# Patient Record
Sex: Female | Born: 1990 | Race: White | Hispanic: Yes | Marital: Single | Smoking: Never smoker
Health system: Southern US, Community
[De-identification: ages and names within clinical notes are randomized; demographics above are authoritative.]

## PROBLEM LIST (undated history)

## (undated) DIAGNOSIS — Z789 Other specified health status: Secondary | ICD-10-CM

## (undated) HISTORY — PX: NO PAST SURGERIES: SHX2092

---

## 2013-07-20 NOTE — L&D Delivery Note (Signed)
Attestation of Attending Supervision of Advanced Practitioner (CNM/NP): Evaluation and management procedures were performed by the Advanced Practitioner under my supervision and collaboration.  I have reviewed the Advanced Practitioner's note and chart, and I agree with the management and plan.  Sabriah Hobbins 03/06/2014 12:22 PM   

## 2013-07-20 NOTE — L&D Delivery Note (Signed)
Delivery Note At 4:51 PM a viable female was delivered via Vaginal, Spontaneous Delivery (Presentation: Middle Occiput Anterior).  APGAR:8 ,9 ; weight: pending .   Placenta status: Intact, Spontaneous.  Cord:  with the following complications: nuchal cord x 1, fairly tight, unable to reduced prior to delivery- 'somersault maneuver used without difficulty.  Anesthesia: None  Episiotomy: none Lacerations: none Est. Blood Loss (mL): 200  Mom to postpartum.  Baby to Couplet care / Skin to Skin.  Shearon BaloKarim Ghanem PA-S performed delivery with CNM.  Cam HaiSHAW, Jolynda Townley CNM 03/02/2014, 5:12 PM

## 2013-09-06 LAB — OB RESULTS CONSOLE RPR: RPR: NONREACTIVE

## 2013-09-06 LAB — OB RESULTS CONSOLE RUBELLA ANTIBODY, IGM: Rubella: IMMUNE

## 2013-09-06 LAB — OB RESULTS CONSOLE HEPATITIS B SURFACE ANTIGEN: HEP B S AG: NEGATIVE

## 2013-09-06 LAB — OB RESULTS CONSOLE ANTIBODY SCREEN: Antibody Screen: NEGATIVE

## 2013-09-06 LAB — OB RESULTS CONSOLE HIV ANTIBODY (ROUTINE TESTING): HIV: NONREACTIVE

## 2013-09-06 LAB — OB RESULTS CONSOLE ABO/RH: RH TYPE: POSITIVE

## 2013-12-28 ENCOUNTER — Other Ambulatory Visit: Payer: Self-pay

## 2014-01-18 ENCOUNTER — Ambulatory Visit (HOSPITAL_COMMUNITY)
Admission: RE | Admit: 2014-01-18 | Discharge: 2014-01-18 | Disposition: A | Discharge status: Home / Self Care | Payer: Medicaid Other | Source: Ambulatory Visit | Attending: Nurse Practitioner | Admitting: Nurse Practitioner

## 2014-01-18 DIAGNOSIS — Z141 Cystic fibrosis carrier: Principal | ICD-10-CM

## 2014-01-18 DIAGNOSIS — O09899 Supervision of other high risk pregnancies, unspecified trimester: Secondary | ICD-10-CM | POA: Insufficient documentation

## 2014-01-18 NOTE — Progress Notes (Addendum)
Genetic Counseling  High-Risk Gestation Note  Appointment Date:  01/18/2014 Referred By: Carrie Click, NP Date of Birth:  [birth year] Partner:  Carrie Gay    Pregnancy History: G2P1 Estimated Date of Delivery: 03/07/14 Estimated Gestational Age: 42w1dAttending: PBenjaman Lobe MD   I met with Ms. RCentral Bridgeand her partner, Carrie Gay for genetic counseling given that routine cystic fibrosis carrier screening identified Carrie Gay Insurance Groupas a carrier for cystic fibrosis (CF).  [facility] Spanish/English interpreter, MFelicity Coyer provided interpretation for today's visit.   We reviewed the results of Carrie Gay Insurance GroupCF carrier screening.  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 CF carrier screening has not yet been performed for her partner.  [name] no additional relatives known to be CF carriers and no known relatives with cystic fibrosis.  [name] no known individuals with cystic fibrosis in his family history, and consanguinity to Ms. Pernie Benigno MAlba Destinewas denied. He reported MPolandancestry. Both family histories were reviewed and were otherwise negative for birth defects, intellectual disability, and known genetic conditions. Without further information regarding the provided family history, an accurate genetic risk cannot be calculated. Further genetic counseling is warranted if more information is obtained.  Classic features of CF include thickened secretions in the lungs, digestive and reproductive systems.  This life-limiting condition is characterized by chronic respiratory infections requiring daily chest therapies, pancreatic dysfunction disrupting the body's ability to break down food and extract nutrients as it should, which may restrict growth.  Infertility commonly occurs in males.  With therapies, such as daily respiratory therapies and medications to aid digestion, the  median lifespan for people with CF is now mid-30's. Treatment may involve lung transplantation in some cases. There can be significant variability in the severity of symptoms and expression of the disease; there is some genotype/phenotype correlation. However, severity cannot always be predicted prenatally.    We reviewed genes and spent time reviewing the autosomal recessive inheritance of cystic fibrosis (CF). We discussed that approximately 1 in 459 Individuals of Hispanic descent is a CF carrier.  We discussed that individuals who are carriers have one copy of the CFTR gene with a disease causing mutation, and their other CFTR gene copy functions correctly. Thus, carriers typically do not have associated medical symptoms. We discussed that when both parents are carriers for CF, each pregnancy has an independent chance for one of the following outcomes: a 25% chance to inherit both mutations and thus have CF; a 50% chance to inherit one gene mutation and be a carrier similar to parents; and a 25% chance to be neither a carrier nor have CF. When one parent is a CF carrier but the other is not, then each pregnancy has a 1 in 2 chance to be a CF carrier but would not be expected to be at increased risk to inherit CF.   There are thought to be thousands of mutations which can cause the CFTR gene to not function properly. Carrier screening is available to assess for the most common disease causing mutations. However, carrier screening does not identify all CF carriers. Thus, a negative CF carrier screen would reduce but not eliminate the chance to be a CF carrier and thus the chance for CF in a pregnancy.  Carrier screening for the most common mutations detects approximately 77% of carriers in the Hispanic population.  Given Carrie Gay reported family history, he would  have the general population risk to be a carrier of approximately 1 in 83, prior to carrier screening. Thus, given that Carrie Gay is a known CF carrier, the risk for CF to be in the current pregnancy, prior to carrier screening for Carrie Gay, is approximately 1 in 184 (0.5%).  We discussed that CF carrier screening for Carrie Gay would further refine the risk for CF in the current pregnancy. Carrie Gay stated that he does not currently have medical insurance. We reviewed the estimated out of pocket cost of approximately $200-250.    We reviewed that when both parents are identified to be CF carriers, prenatal diagnosis via amniocentesis (or chorionic villus sampling in the first trimester) would be available, if desired. The risks, benefits, and limitations of amniocentesis were reviewed. A fetus with cystic fibrosis typically appears normal on targeted ultrasound, although rarely echogenic bowel is visualized on ultrasound. However, the presence of echogenic bowel on targeted ultrasound is not diagnostic for CF in a pregnancy, nor does the absence of echogenic bowel on ultrasound rule out CF in the pregnancy. We discussed that postnatal testing for CF can also be performed for babies identified to be at risk to inherit CF. They understand that in [place], the newborn screening test will detect CF, but that carriers may come back as false positives.  Carrie Gay and her partner indicated that they would not be interested in prenatal diagnosis for CF via amniocentesis, even in the event that Carrie Gay was also identified to be a CF carrier. After careful consideration, Carrie Gay declined CF carrier screening today, given the associated cost and given that the couple was comfortable with postnatal assessment for cystic fibrosis for the pregnancy.   Carrie Gay denied exposure to environmental toxins or chemical agents. She denied the use of alcohol, tobacco or street drugs. She denied significant viral illnesses during the course of her pregnancy. Her medical and surgical histories were  noncontributory.   I counseled this couple regarding the above risks and available options.  The approximate face-to-face time with the genetic counselor was 45 minutes.  Chipper Oman, MS Certified Genetic Counselor 01/18/2014

## 2014-02-12 LAB — OB RESULTS CONSOLE GBS: GBS: POSITIVE

## 2014-02-12 LAB — OB RESULTS CONSOLE GC/CHLAMYDIA
CHLAMYDIA, DNA PROBE: NEGATIVE
Gonorrhea: NEGATIVE

## 2014-02-19 ENCOUNTER — Other Ambulatory Visit (HOSPITAL_COMMUNITY): Payer: Self-pay | Admitting: Nurse Practitioner

## 2014-02-19 DIAGNOSIS — Z3689 Encounter for other specified antenatal screening: Secondary | ICD-10-CM

## 2014-02-21 ENCOUNTER — Other Ambulatory Visit (HOSPITAL_COMMUNITY): Payer: Self-pay | Admitting: Nurse Practitioner

## 2014-02-21 ENCOUNTER — Ambulatory Visit (HOSPITAL_COMMUNITY)
Admission: RE | Admit: 2014-02-21 | Discharge: 2014-02-21 | Disposition: A | Discharge status: Home / Self Care | Payer: Medicaid Other | Source: Ambulatory Visit | Attending: Nurse Practitioner | Admitting: Nurse Practitioner

## 2014-02-21 DIAGNOSIS — Z1389 Encounter for screening for other disorder: Secondary | ICD-10-CM | POA: Insufficient documentation

## 2014-02-21 DIAGNOSIS — Z363 Encounter for antenatal screening for malformations: Secondary | ICD-10-CM | POA: Insufficient documentation

## 2014-02-21 DIAGNOSIS — Z3689 Encounter for other specified antenatal screening: Secondary | ICD-10-CM

## 2014-02-21 DIAGNOSIS — O358XX0 Maternal care for other (suspected) fetal abnormality and damage, not applicable or unspecified: Secondary | ICD-10-CM | POA: Insufficient documentation

## 2014-03-02 ENCOUNTER — Inpatient Hospital Stay (HOSPITAL_COMMUNITY)
Admission: AD | Admit: 2014-03-02 | Discharge: 2014-03-04 | DRG: 775 | Disposition: A | Discharge status: Home / Self Care | Payer: Medicaid Other | Source: Ambulatory Visit | Attending: Obstetrics and Gynecology | Admitting: Obstetrics and Gynecology

## 2014-03-02 ENCOUNTER — Encounter (HOSPITAL_COMMUNITY): Payer: Self-pay | Admitting: *Deleted

## 2014-03-02 DIAGNOSIS — O99892 Other specified diseases and conditions complicating childbirth: Secondary | ICD-10-CM | POA: Diagnosis present

## 2014-03-02 DIAGNOSIS — Z22330 Carrier of Group B streptococcus: Secondary | ICD-10-CM

## 2014-03-02 DIAGNOSIS — O9989 Other specified diseases and conditions complicating pregnancy, childbirth and the puerperium: Secondary | ICD-10-CM

## 2014-03-02 DIAGNOSIS — IMO0001 Reserved for inherently not codable concepts without codable children: Secondary | ICD-10-CM

## 2014-03-02 DIAGNOSIS — O479 False labor, unspecified: Secondary | ICD-10-CM | POA: Diagnosis present

## 2014-03-02 HISTORY — DX: Other specified health status: Z78.9

## 2014-03-02 LAB — ABO/RH: ABO/RH(D): O POS

## 2014-03-02 LAB — CBC
HCT: 39.9 % (ref 36.0–46.0)
Hemoglobin: 13.7 g/dL (ref 12.0–15.0)
MCH: 33.3 pg (ref 26.0–34.0)
MCHC: 34.3 g/dL (ref 30.0–36.0)
MCV: 96.8 fL (ref 78.0–100.0)
Platelets: 198 10*3/uL (ref 150–400)
RBC: 4.12 MIL/uL (ref 3.87–5.11)
RDW: 13.3 % (ref 11.5–15.5)
WBC: 8.2 10*3/uL (ref 4.0–10.5)

## 2014-03-02 LAB — TYPE AND SCREEN
ABO/RH(D): O POS
ANTIBODY SCREEN: NEGATIVE

## 2014-03-02 MED ORDER — ONDANSETRON HCL 4 MG/2ML IJ SOLN: 4.0000 mg | Freq: Four times a day (QID) | INTRAMUSCULAR | Status: DC | PRN

## 2014-03-02 MED ORDER — PRENATAL MULTIVITAMIN CH: 1.0000 | ORAL_TABLET | Freq: Every day | ORAL | Status: DC

## 2014-03-02 MED ORDER — ONDANSETRON HCL 4 MG PO TABS: 4.0000 mg | ORAL_TABLET | ORAL | Status: DC | PRN

## 2014-03-02 MED ORDER — OXYTOCIN 40 UNITS IN LACTATED RINGERS INFUSION - SIMPLE MED: 62.5000 mL/h | INTRAVENOUS | Status: DC

## 2014-03-02 MED ORDER — LACTATED RINGERS IV SOLN: 500.0000 mL | INTRAVENOUS | Status: DC | PRN

## 2014-03-02 MED ORDER — PRENATAL MULTIVITAMIN CH
1.0000 | ORAL_TABLET | Freq: Every day | ORAL | Status: DC
  Administered 2014-03-03 – 2014-03-04 (×2): 1 via ORAL
  Filled 2014-03-02 (×2): qty 1

## 2014-03-02 MED ORDER — LACTATED RINGERS IV SOLN
INTRAVENOUS | Status: DC
  Administered 2014-03-02: 16:00:00 via INTRAVENOUS

## 2014-03-02 MED ORDER — LIDOCAINE HCL (PF) 1 % IJ SOLN
30.0000 mL | INTRAMUSCULAR | Status: DC | PRN
Start: 2014-03-02 — End: 2014-03-02
  Filled 2014-03-02: qty 30

## 2014-03-02 MED ORDER — DIBUCAINE 1 % RE OINT: 1.0000 "application " | TOPICAL_OINTMENT | RECTAL | Status: DC | PRN

## 2014-03-02 MED ORDER — LACTATED RINGERS IV SOLN: INTRAVENOUS | Status: DC

## 2014-03-02 MED ORDER — CITRIC ACID-SODIUM CITRATE 334-500 MG/5ML PO SOLN: 30.0000 mL | ORAL | Status: DC | PRN

## 2014-03-02 MED ORDER — IBUPROFEN 600 MG PO TABS: 600.0000 mg | ORAL_TABLET | Freq: Four times a day (QID) | ORAL | Status: DC | PRN

## 2014-03-02 MED ORDER — TETANUS-DIPHTH-ACELL PERTUSSIS 5-2.5-18.5 LF-MCG/0.5 IM SUSP: 0.5000 mL | Freq: Once | INTRAMUSCULAR | Status: DC

## 2014-03-02 MED ORDER — OXYTOCIN BOLUS FROM INFUSION: 500.0000 mL | INTRAVENOUS | Status: DC

## 2014-03-02 MED ORDER — WITCH HAZEL-GLYCERIN EX PADS: 1.0000 "application " | MEDICATED_PAD | CUTANEOUS | Status: DC | PRN

## 2014-03-02 MED ORDER — AMPICILLIN SODIUM 2 G IJ SOLR
2.0000 g | Freq: Once | INTRAMUSCULAR | Status: AC
  Administered 2014-03-02: 2 g via INTRAVENOUS
  Filled 2014-03-02: qty 2000

## 2014-03-02 MED ORDER — OXYCODONE-ACETAMINOPHEN 5-325 MG PO TABS
1.0000 | ORAL_TABLET | ORAL | Status: DC | PRN
  Administered 2014-03-03 – 2014-03-04 (×4): 1 via ORAL
  Filled 2014-03-02 (×4): qty 1

## 2014-03-02 MED ORDER — IBUPROFEN 600 MG PO TABS
600.0000 mg | ORAL_TABLET | Freq: Four times a day (QID) | ORAL | Status: DC | PRN
Start: 2014-03-02 — End: 2014-03-02
  Administered 2014-03-02: 600 mg via ORAL
  Filled 2014-03-02: qty 1

## 2014-03-02 MED ORDER — BENZOCAINE-MENTHOL 20-0.5 % EX AERO
1.0000 "application " | INHALATION_SPRAY | CUTANEOUS | Status: DC | PRN
  Filled 2014-03-02: qty 56

## 2014-03-02 MED ORDER — OXYCODONE-ACETAMINOPHEN 5-325 MG PO TABS: 1.0000 | ORAL_TABLET | ORAL | Status: DC | PRN

## 2014-03-02 MED ORDER — ACETAMINOPHEN 325 MG PO TABS: 650.0000 mg | ORAL_TABLET | ORAL | Status: DC | PRN

## 2014-03-02 MED ORDER — ONDANSETRON HCL 4 MG/2ML IJ SOLN: 4.0000 mg | INTRAMUSCULAR | Status: DC | PRN

## 2014-03-02 MED ORDER — LIDOCAINE HCL (PF) 1 % IJ SOLN: 30.0000 mL | INTRAMUSCULAR | Status: DC | PRN

## 2014-03-02 MED ORDER — IBUPROFEN 600 MG PO TABS
600.0000 mg | ORAL_TABLET | Freq: Four times a day (QID) | ORAL | Status: DC
  Administered 2014-03-02 – 2014-03-04 (×8): 600 mg via ORAL
  Filled 2014-03-02 (×8): qty 1

## 2014-03-02 MED ORDER — OXYTOCIN 40 UNITS IN LACTATED RINGERS INFUSION - SIMPLE MED
62.5000 mL/h | INTRAVENOUS | Status: DC
  Administered 2014-03-02: 62.5 mL/h via INTRAVENOUS
  Filled 2014-03-02: qty 1000

## 2014-03-02 MED ORDER — FLEET ENEMA 7-19 GM/118ML RE ENEM
1.0000 | ENEMA | RECTAL | Status: DC | PRN
Start: 2014-03-02 — End: 2014-03-02

## 2014-03-02 MED ORDER — OXYCODONE-ACETAMINOPHEN 5-325 MG PO TABS
1.0000 | ORAL_TABLET | ORAL | Status: DC | PRN
  Administered 2014-03-02: 1 via ORAL
  Filled 2014-03-02: qty 1

## 2014-03-02 MED ORDER — ZOLPIDEM TARTRATE 5 MG PO TABS: 5.0000 mg | ORAL_TABLET | Freq: Every evening | ORAL | Status: DC | PRN

## 2014-03-02 MED ORDER — SIMETHICONE 80 MG PO CHEW: 80.0000 mg | CHEWABLE_TABLET | ORAL | Status: DC | PRN

## 2014-03-02 MED ORDER — ACETAMINOPHEN 325 MG PO TABS
650.0000 mg | ORAL_TABLET | ORAL | Status: DC | PRN
Start: 2014-03-02 — End: 2014-03-02

## 2014-03-02 MED ORDER — LANOLIN HYDROUS EX OINT: TOPICAL_OINTMENT | CUTANEOUS | Status: DC | PRN

## 2014-03-02 MED ORDER — SODIUM CHLORIDE 0.9 % IV SOLN: 2.0000 g | Freq: Once | INTRAVENOUS | Status: DC

## 2014-03-02 MED ORDER — DIPHENHYDRAMINE HCL 25 MG PO CAPS: 25.0000 mg | ORAL_CAPSULE | Freq: Four times a day (QID) | ORAL | Status: DC | PRN

## 2014-03-02 MED ORDER — SENNOSIDES-DOCUSATE SODIUM 8.6-50 MG PO TABS
2.0000 | ORAL_TABLET | ORAL | Status: DC
  Administered 2014-03-02 – 2014-03-03 (×2): 2 via ORAL
  Filled 2014-03-02 (×2): qty 2

## 2014-03-02 NOTE — MAU Note (Signed)
PT  SAYS SHE GETS PNC  WITH HD - INTERPRETER- MADAY,     LAST SEEN ON Monday- VE - 1 CM.   DENIES HSV AND MRSA.   GBS- POSITIVE.

## 2014-03-02 NOTE — H&P (Signed)
Carrie Gay is a 23 y.o. female G2P1001 @ 38.5wks presenting to the Labor and Delivery suite for active labor. Denies bldg, N/V/D. Maternal Medical History:  Reason for admission: Nausea.    OB History   Grav Para Term Preterm Abortions TAB SAB Ect Mult Living   2 1 1       1      Past Medical History  Diagnosis Date  . Medical history non-contributory    Past Surgical History  Procedure Laterality Date  . No past surgeries     Family History: family history is not on file. Social History:  reports that she has never smoked. She does not have any smokeless tobacco history on file. She reports that she does not drink alcohol or use illicit drugs.   Prenatal Transfer Tool  Maternal Diabetes: No Genetic Screening: Abnormal:  Results: Other: Quad Maternal Ultrasounds/Referrals: Normal Fetal Ultrasounds or other Referrals:  None Maternal Substance Abuse:  No Significant Maternal Medications:  None Significant Maternal Lab Results:  Lab values include: Group B Strep positive Other Comments:  Cystic Fibrosis Carrier  Review of Systems  Constitutional: Positive for diaphoresis. Negative for fever and chills.  Eyes: Negative for blurred vision and double vision.  Gastrointestinal: Negative for nausea and vomiting.  Neurological: Negative for dizziness, tingling and headaches.    Dilation: 8.5 Effacement (%): 90 Exam by:: DCALLAWAY, RN Blood pressure 120/76, pulse 62, temperature 97.3 F (36.3 C), temperature source Oral, resp. rate 20, height 4\' 10"  (1.473 m), weight 56.7 kg (125 lb). Exam Physical Exam  Constitutional: She appears well-developed and well-nourished. She appears distressed.  HENT:  Head: Normocephalic and atraumatic.   EFM 130s, +accels, no decels Toco: ctx q 2 mins  Prenatal labs: ABO, Rh: O/Positive/-- (02/18 0000) Antibody: Negative (02/18 0000) Rubella: Immune (02/18 0000) RPR: Nonreactive (02/18 0000)  HBsAg: Negative (02/18 0000)  HIV:  Non-reactive (02/18 0000)  GBS: Positive (07/27 0000)   Assessment/Plan: Advanced Active Labor  GBS pos  [facility] Anticipate SVD Expectant management Attempt to give Amp for GBS prophylaxis  Shearon BaloGhanem, Karim M 03/02/2014, 4:22 PM  I have seen and examined this patient and I agree with the above. Cam HaiSHAW, Karlynn Furrow CNM 10:11 PM 03/09/2014

## 2014-03-03 LAB — RPR

## 2014-03-03 NOTE — Progress Notes (Signed)
Post Partum Day 1 Subjective: Eating, drinking, voiding, ambulating well.  +flatus.  Lochia and pain wnl.  Denies dizziness, lightheadedness, or sob. No complaints.  Rapid birth, pt reports birthing w/in 1hr of coming to hospital, GBS pos, so inadequate tx  Objective: Blood pressure 119/61, pulse 55, temperature 98.1 F (36.7 C), temperature source Oral, resp. rate 18, height 4\' 10"  (1.473 m), weight 56.7 kg (125 lb), SpO2 99.00%, unknown if currently breastfeeding.  Physical Exam:  General: alert, cooperative and no distress Lochia: appropriate Uterine Fundus: firm Incision: n/a DVT Evaluation: No evidence of DVT seen on physical exam. Negative Homan's sign. No cords or calf tenderness. No significant calf/ankle edema.   Recent Labs  03/02/14 1615  HGB 13.7  HCT 39.9    Assessment/Plan: Plan for discharge tomorrow and Breastfeeding Plans depo for contraception   LOS: 1 day   Marge DuncansBooker, [place] Miyoshi Randall 03/03/2014, 8:54 AM

## 2014-03-03 NOTE — Lactation Note (Signed)
This note was copied from the chart of Carrie Gay. Lactation Consultation Note  Interpreter Jessica Present.  P2.  Ex BF one year. Mother denies problems or soreness.   Mother states she has been taught hand expression and has seen colostrum. Reviewed basics, Baby & Me booklet. Mom encouraged to feed baby 8-12 times/24 hours and with feeding cues.  Mom made aware of O/P services, breastfeeding support groups, community resources, and our phone # for post-discharge questions.  Encouraged mother to have RN view her next feeding to access latch.     Patient Name: Carrie Awilda BillRubi Benigno Gay ZOXWR'UToday's Date: 03/03/2014 Reason for consult: Initial assessment   Maternal Data Has patient been taught Hand Expression?: Yes Does the patient have breastfeeding experience prior to this delivery?: Yes  Feeding Feeding Type: Breast Fed Length of feed: 10 min  LATCH Score/Interventions                      Lactation Tools Discussed/Used     Consult Status Consult Status: Follow-up Date: 03/04/14 Follow-up type: In-patient    Dahlia ByesBerkelhammer, [facility] 03/03/2014, 12:15 PM

## 2014-03-04 MED ORDER — IBUPROFEN 600 MG PO TABS: 600.0000 mg | ORAL_TABLET | Freq: Four times a day (QID) | ORAL | Status: AC

## 2014-03-04 NOTE — Discharge Summary (Signed)
Obstetric Discharge Summary Reason for Admission: onset of labor Prenatal Procedures: ultrasound Intrapartum Procedures: spontaneous vaginal delivery Postpartum Procedures: none Complications-Operative and Postpartum: none Hemoglobin  Date Value Ref Range Status  03/02/2014 13.7  12.0 - 15.0 g/dL Final     HCT  Date Value Ref Range Status  03/02/2014 39.9  36.0 - 46.0 % Final    Physical Exam:  General: alert, cooperative, appears stated age and no distress Lochia: appropriate Uterine Fundus: firm Incision: n/a DVT Evaluation: No evidence of DVT seen on physical exam. Negative Homan's sign. No significant calf/ankle edema.  Discharge Diagnoses: Term Pregnancy-delivered  Discharge Information: Date: 03/04/2014 Activity: pelvic rest Diet: routine Medications: PNV and Ibuprofen Condition: stable and improved Instructions: refer to practice specific booklet Discharge to: home Follow-up Information   [facility] Schedule an appointment as soon as possible for a visit in 6 weeks. (postpartum follow up and Depo)    Specialty:  Home Health Services   Contact information:   [facility] Coord[facility] [address] [place] [address] [phone]       Newborn Data: Live born female  Birth Weight: 6 lb 4.5 oz (2850 g) APGAR: 8, 9  Home with mother.  Carrie Gay, Carrie Gay 03/04/2014, 12:12 PM

## 2014-03-04 NOTE — Discharge Summary (Signed)
Obstetric Discharge Summary Reason for Admission: onset of labor Prenatal Procedures: NST Intrapartum Procedures: spontaneous vaginal delivery Postpartum Procedures: none Complications-Operative and Postpartum: none Hemoglobin  Date Value Ref Range Status  03/02/2014 13.7  12.0 - 15.0 g/dL Final     HCT  Date Value Ref Range Status  03/02/2014 39.9  36.0 - 46.0 % Final   Hospital Course:  Patient was seen in MAU and found to be in active labor. She was admitted to L&D where she subsequently underwent NSVD without incident. She had an uncomplicated postpartum course. She is now ambulating, tolerating po, +BM, pain well controlled, and minimal vaginal bleeding. She desires depo for birth control and she is breast / bottle feeding.   Delivery Note  At 4:51 PM a viable female was delivered via Vaginal, Spontaneous Delivery (Presentation: Middle Occiput Anterior). APGAR:8 ,9 ; weight: pending .  Placenta status: Intact, Spontaneous. Cord: with the following complications: nuchal cord x 1, fairly tight, unable to reduced prior to delivery- 'somersault maneuver used without difficulty.  Anesthesia: None  Episiotomy: none  Lacerations: none  Est. Blood Loss (mL): 200  Mom to postpartum. Baby to Couplet care / Skin to Skin.  Shearon BaloKarim Ghanem PA-S performed delivery with CNM.  Cam HaiSHAW, KIMBERLY CNM  03/02/2014, 5:12 PM    Physical Exam:  General: alert, cooperative and no distress Lochia: appropriate Uterine Fundus: firm Incision: N/A DVT Evaluation: No evidence of DVT seen on physical exam. No cords or calf tenderness.  Discharge Diagnoses: Term Pregnancy-delivered  Discharge Information: Date: 03/04/2014 Activity: unrestricted and pelvic rest Diet: routine Medications: PNV and Ibuprofen Condition: stable Instructions: refer to practice specific booklet Discharge to: home Follow-up Information   [facility] Schedule an appointment as soon  as possible for a visit in 6 weeks. (postpartum follow up and Depo)    Specialty:  Home Health Services   Contact information:   [facility] Coordination for [facility] [address] [place] [address] [phone]       Newborn Data: Live born female  Birth Weight: 6 lb 4.5 oz (2850 g) APGAR: 8, 9  Home with mother.  Sherral Dirocco, Hillery HunterCaleb G 03/04/2014, 7:33 AM

## 2014-03-04 NOTE — Lactation Note (Signed)
This note was copied from the chart of Carrie Lennar Corporationubi Benigno Gay. Lactation Consultation Note     Follow up consult with this mom and term baby, weighing under 6 pounds today, at 5-14, and 6% weight loss. Mom reports breast feeding going well, and the baby feeding more than every 3 hours.  I advised that if he does not wake to feed at least every 3, to try and feed at least this often, due to his small size. I observed mom latching baby. She used cradle hold, and he latched easily, but his bottom lip was pulled in. I showed mom how to latch in cross cradle, and then switch to cradle, to obtain a deeper latch. i also showed her to keep the baby across her chest, and skin to skin. She has been breast feeding with the baby wrapped in  blanket.  Mom may have to go back to work, and was planning on breast feeding only at night. i explained that she may lose her milk supply this way. 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 i advised that when looking for a job, that she consider whether or not she would have time to pump. I told her by law she is allowed to pump at work.  Mom very receptive to my teaching. I spoke to mom through a Spanish interpreter for the consult.  Patient Name: Carrie Gay HYQMV'HToday's Date: 03/04/2014 Reason for consult: Follow-up assessment;Infant < 6lbs   Maternal Data    Feeding    LATCH Score/Interventions Latch: Grasps breast easily, tongue down, lips flanged, rhythmical sucking.  Audible Swallowing: A few with stimulation  Type of Nipple: Everted at rest and after stimulation  Comfort (Breast/Nipple): Soft / non-tender     Hold (Positioning): Assistance needed to correctly position infant at breast and maintain latch. Intervention(s): Breastfeeding basics reviewed;Support Pillows;Position options;Skin to skin  LATCH Score: 8  Lactation Tools Discussed/Used     Consult Status Consult Status: Complete Follow-up type: Call as  needed    Alfred LevinsLee, Catie Chiao Anne 03/04/2014, 1:46 PM

## 2014-03-04 NOTE — Discharge Instructions (Signed)
Antes de que el bebé llegue a casa °(Before Baby Comes Home) °Estas son algunas cosas que usted debe tener antes que su bebé llegue a casa. Pregunte nuevamente si no comprende. Pregunte cuándo debe ver al médico nuevamente. °· Asiento infantil para el auto (exigido por ley). °· Camas separadas. °¨ Si no tiene una cama para el bebé o una cuna, puede hacerla con un cajón de una cómoda, una caja, cartón, cesta para la ropa, etc. °¨ No deje que el bebé duerma en una cama con usted u otra persona. °Insumos para la alimentación infantil: °· 6 a 8 botellas (8 onzas). °· 6 a 8 tetinas. °· Jarra medidora. °· Cuchara medidora. °· Cepillo para limpiar botellas. °· Esterilizador (o use cualquier olla o sartén grande con tapa). °· Leche maternizada que contiene hierro. °· Elementos para hervir y enfriar el agua. °Insumos de lactancia materna. °· Sacaleche. °· Crema para los pezones. °Ropa: °· 24 a 36 pañales y cubrepañales de plástico y una caja de pañales desechables. Usted puede necesitar tanto como 10 a 12 pañales por día. °· 3 camisas (otras prendas dependerá de la época del año y el clima). °· 3 mantillas. °· 3 pijamas o camisón de bebé . °· 3 baberos. °Equipamiento de baño: °· Jabón suave. °· Vaselina. No use aceite o talco para el bebé . °· Toalla de tela suave y paño para lavarlo. °· Pompones de algodón. °· Fuente de baño especial para el bebé. Sólo bañe al bebé con una esponja hasta que el cordón umbilical y la circuncisión hayan curado. °Otros suministros: °· Un termómetro y una pera de goma que serán entregados para que lleve a su casa cuando el bebé salga del hospital. Pregunte al médico cómo debe tomar la temperatura del bebé. °· 1 ó 2 chupetes. °Prepárese para una emergencia:  °· Sepa cómo llegar al hospital y sepa en qué lugar admiten al bebé. °· Reúna todos los números telefónicos de los proveedores cerca del teléfono de la casa y en su celular, si tiene uno. °Prepare a su familia:  °· Hable con sus hijos acerca  del nuevo bebé y pregunte qué piensan al respecto. °· Decida cómo desea manejar las visitas y a los otros miembros de la familia. °· Acepte ofertas de ayuda con el bebé. Necesitará tiempo para adaptarse. °Sepa cuándo debe llamar al médico:  °SOLICITE AYUDA DE INMEDIATO SI:  °· Presenta una temperatura mayor a 100.4° F (38° C). °· Abultamiento de las fontanelas en la cabeza. °· Síntomas de deshidratación como llanto sin lágrimas o ausencia de pañales mojados luego de 6 horas. °· Respiración rápida. °· Disminuye el estado de alerta. °Document Released: 10/02/2008 Document Revised: 09/28/2011 °ExitCare® Patient Information ©2015 [employer]. This information is not intended to replace advice given to you by your health care provider. Make sure you discuss any questions you have with your health care provider.Parto vaginal, Cuidados posteriores  °(Vaginal Delivery, Care After) °Siga estas instrucciones durante las próximas semanas. Estas indicaciones para el alta le proporcionan información general acerca de cómo deberá cuidarse después del parto. El médico también podrá darle instrucciones específicas. El tratamiento ha sido planificado según las prácticas médicas actuales, pero en algunos casos pueden ocurrir problemas. Comuníquese con el médico si tiene algún problema o tiene preguntas al volver a su casa.  °INSTRUCCIONES PARA EL CUIDADO EN EL HOGAR  °· Tome sólo medicamentos de venta libre o recetados, según las indicaciones del médico o del farmacéutico. °· No beba alcohol, especialmente si está   amamantando o toma analgésicos. °· No mastique tabaco ni fume. °· No consuma drogas. °· Continúe con un adecuado cuidado perineal. El buen cuidado perineal incluye: °¨ Higienizarse de adelante hacia atrás. °¨ Mantener la zona perineal limpia. °· No use tampones ni duchas vaginales hasta que su médico la autorice. °· Dúchese, lávese el cabello y tome baños de inmersión según las indicaciones de su médico. °· Utilice un  sostén que le ajuste bien y que brinde buen soporte a sus mamas. °· Consuma alimentos saludables. °· Beba suficiente líquido para mantener la orina clara o de color amarillo pálido. °· Consuma alimentos ricos en fibra como cereales y panes integrales, arroz, frijoles y frutas y verduras frescas todos los días. Estos alimentos pueden ayudarla a prevenir o aliviar el estreñimiento. °· Siga las recomendaciones de su médico relacionadas con la reanudación de actividades como subir escaleras, conducir automóviles, levantar objetos, hacer ejercicios o viajar. °· Hable con su médico acerca de reanudar la actividad sexual. Volver a la actividad sexual depende del riesgo de infección, la velocidad de la curación y la comodidad y su deseo de reanudarla. °· Trate de que alguien la ayude con las actividades del hogar y con el recién nacido al menos durante un par de días después de salir del hospital. °· Descanse todo lo que pueda. Trate de descansar o tomar una siesta mientras el bebé está durmiendo. °· Aumente sus actividades gradualmente. °· Cumpla con todas las visitas de control programadas para después del parto. Es muy importante asistir a todas las citas programadas de seguimiento. En estas citas, su médico va a controlarla para asegurarse de que esté sanando física y emocionalmente. °SOLICITE ATENCIÓN MÉDICA SI:  °· Elimina coágulos grandes por la vagina. Guarde algunos coágulos para mostrarle al médico. °· Tiene una secreción con feo olor que proviene de la vagina. °· Tiene dificultad para orinar. °· Orina con frecuencia. °· Siente dolor al orinar. °· Nota un cambio en sus movimientos intestinales. °· Aumenta el enrojecimiento, el dolor o la hinchazón en la zona de la incisión vaginal (episiotomía) o el desgarro vaginal. °· Tiene pus que drena por la episiotomía o el desgarro vaginal. °· La episiotomía o el desgarro vaginal se abren. °· Sus mamas le duelen, están duras o enrojecidas. °· Sufre un dolor intenso de  cabeza. °· Tiene visión borrosa o ve manchas. °· Se siente triste o deprimida. °· Tiene pensamientos acerca de lastimarse o dañar al recién nacido. °· Tiene preguntas acerca de su cuidado personal, el cuidado del recién nacido o acerca de los medicamentos. °· Se siente mareada o sufre un desmayo. °· Tiene una erupción. °· Tiene náuseas o vómitos. °· Usted amamantó al bebé y no ha tenido su período menstrual dentro de las 12 semanas después de dejar de amamantar. °· No amamanta al bebé y no tuvo su período menstrual en las últimas 12° semanas después del parto. °· Tiene fiebre. °SOLICITE ATENCIÓN MÉDICA DE INMEDIATO SI:  °· Siente dolor persistente. °· Siente dolor en el pecho. °· Le falta el aire. °· Se desmaya. °· Siente dolor en la pierna. °· Siente dolor en el estómago. °· El sangrado vaginal satura dos o más apósitos en 1 hora. °ASEGÚRESE DE QUE:  °· Comprende estas instrucciones. °· Controlará su enfermedad. °· Recibirá ayuda de inmediato si no mejora o si empeora. °Document Released: 07/06/2005 Document Revised: 03/08/2013 °ExitCare® Patient Information ©2015 [employer]. This information is not intended to replace advice given to you by your health care provider. Make   sure you discuss any questions you have with your health care provider. ° °

## 2014-04-01 NOTE — Discharge Summary (Signed)
i agree with above assessment 

## 2014-05-22 ENCOUNTER — Encounter (HOSPITAL_COMMUNITY): Payer: Self-pay | Admitting: *Deleted

## 2015-06-26 IMAGING — US US OB DETAIL+14 WK
1 series · 12 of 28 positions shown · non-contrast
Comparison: none

[Series 1: us ob comp +14 wk · 12 of 84 slices shown]
[im 4/84]
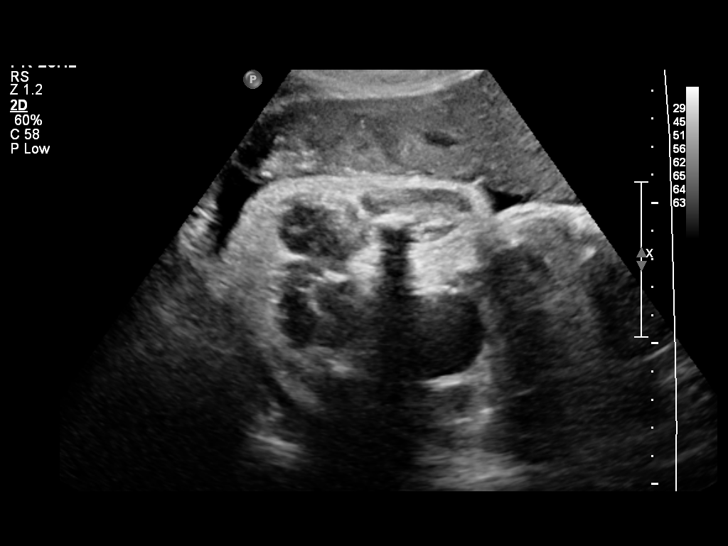
[im 10/84]
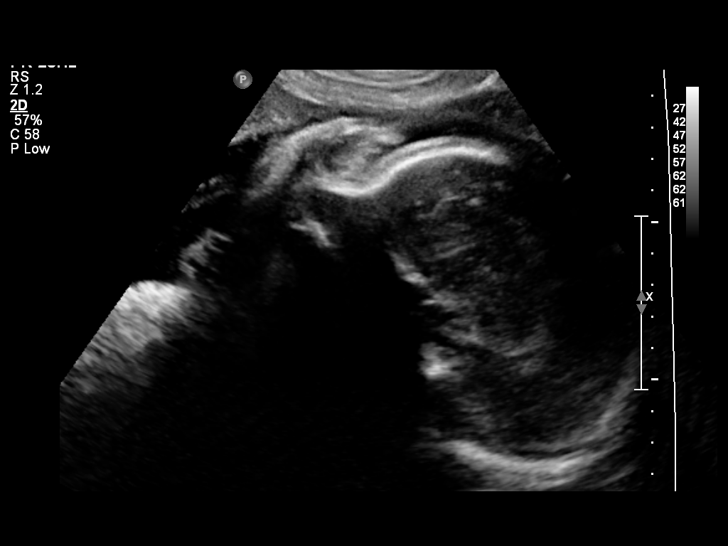
[im 16/84]
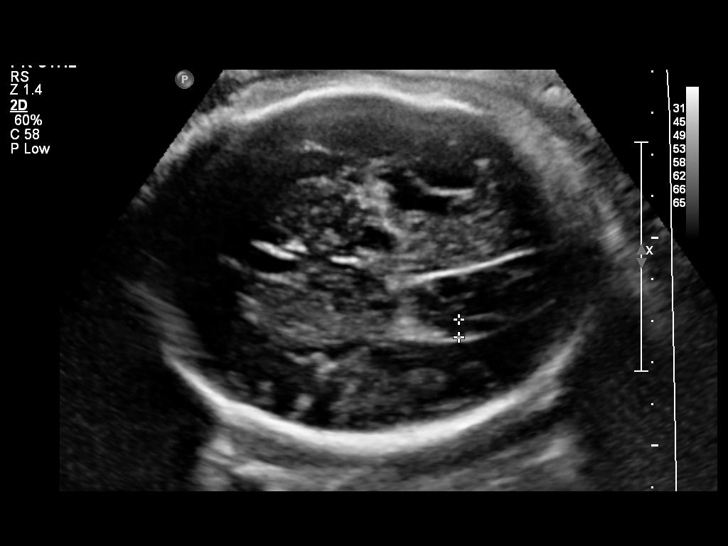
[im 25/84]
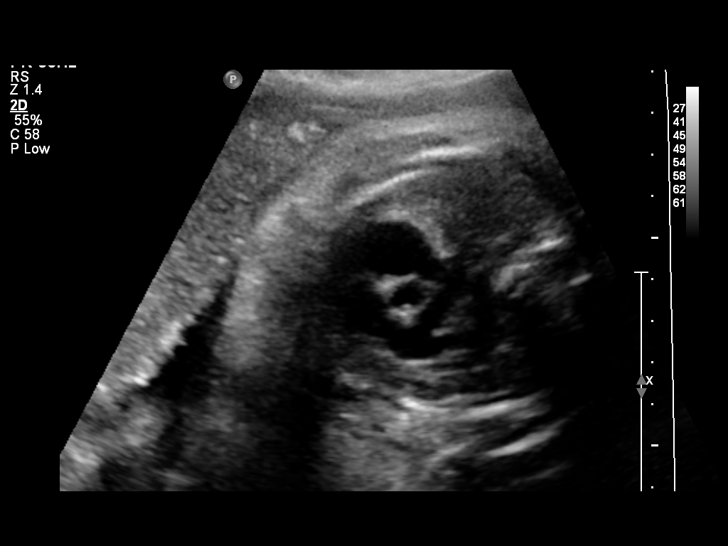
[im 31/84]
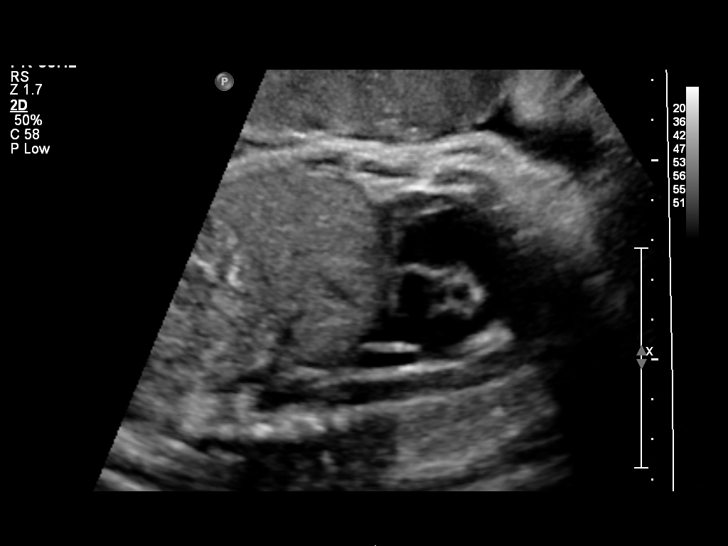
[im 37/84]
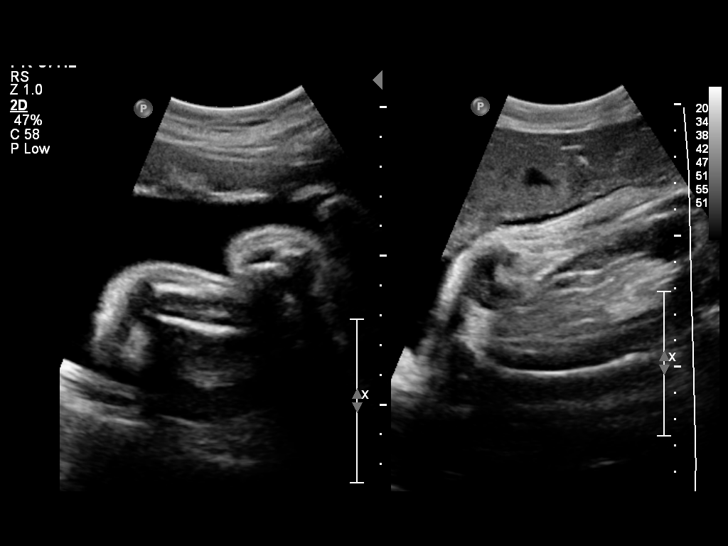
[im 47/84]
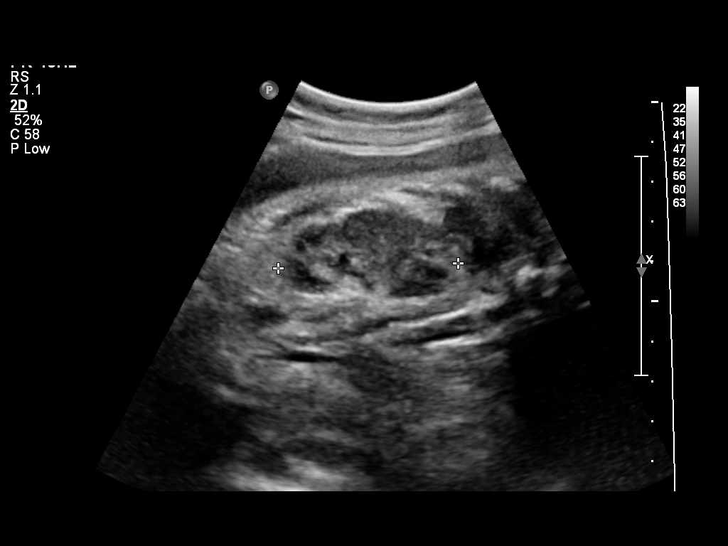
[im 53/84]
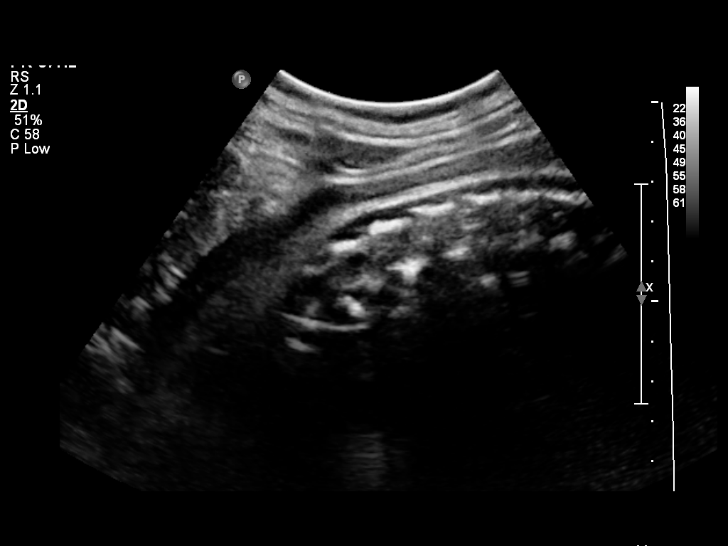
[im 59/84]
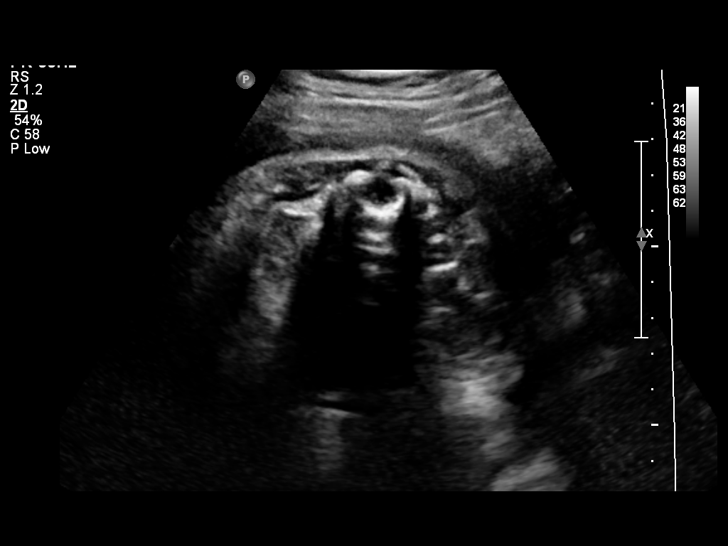
[im 68/84]
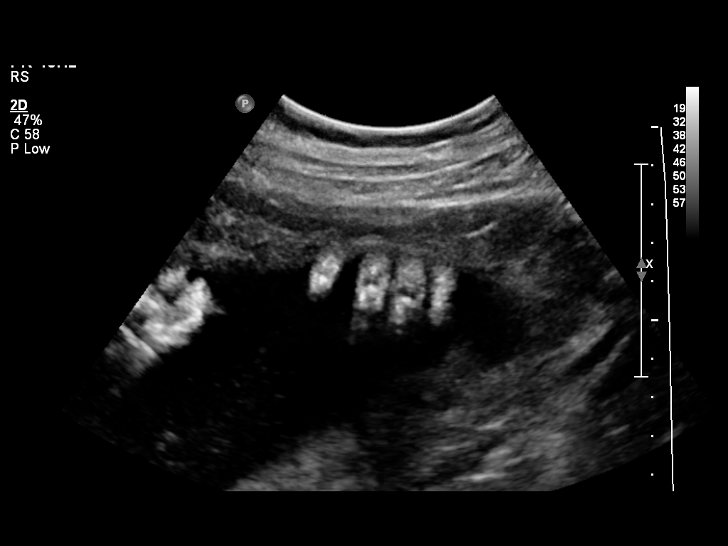
[im 74/84]
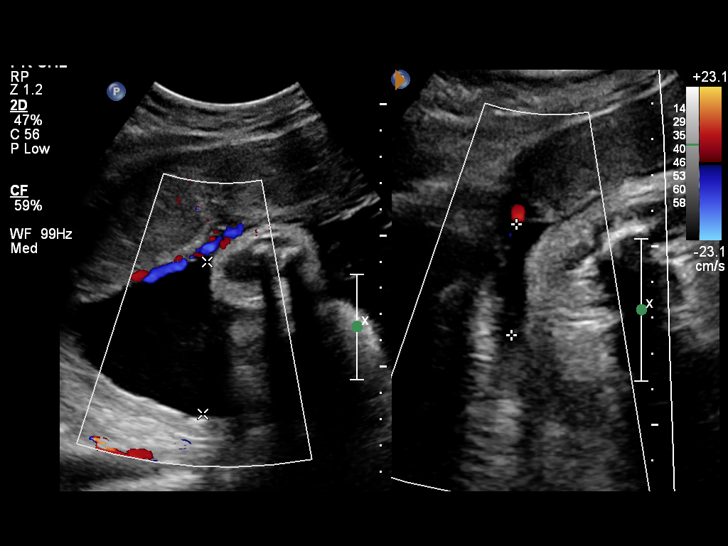
[im 80/84]
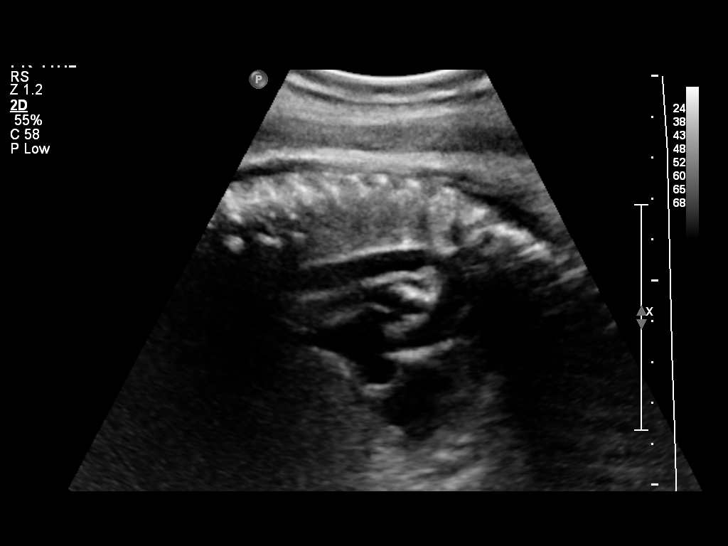

[12 of 28 positions shown; findings below may reference images not displayed]

OBSTETRICS REPORT
                    (Corrected Final 03/01/2014 [DATE])

                                                         [REDACTED]-
                                                         Faculty Physician
Service(s) Provided

 US OB DETAIL + 14 WK                                  76811.0
Indications

 Medical complication of pregnancy (cystic fibrosis
 carrier)
 Detailed fetal anatomic survey
Fetal Evaluation

 Num Of Fetuses:    1
 Fetal Heart Rate:  132                          bpm
 Cardiac Activity:  Observed
 Presentation:      Cephalic
 Placenta:          Anterior, above cervical os
 P. Cord            Visualized, central
 Insertion:

 Amniotic Fluid
 AFI FV:      Subjectively within normal limits
 AFI Sum:     16.79   cm       65  %Tile     Larg Pckt:    5.78  cm
 RUQ:   5.78    cm   RLQ:    3.96   cm    LUQ:   3.1     cm   LLQ:    3.95   cm
Biometry

 BPD:     81.9  mm     G. Age:  33w 0d                CI:        70.72   70 - 86
                                                      FL/HC:      22.1   20.9 -

 HC:     310.4  mm     G. Age:  34w 4d      < 3  %    HC/AC:      0.94   0.92 -

 AC:     328.7  mm     G. Age:  36w 5d       33  %    FL/BPD:     83.6   71 - 87
 FL:      68.5  mm     G. Age:  35w 1d        3  %    FL/AC:      20.8   20 - 24
 HUM:       61  mm     G. Age:  35w 2d       28  %

 Est. FW:    0241  gm      6 lb 1 oz     24  %
Gestational Age

 Clinical EDD:  38w 0d                                        EDD:   03/07/14
 U/S Today:     34w 6d                                        EDD:   03/29/14
 Best:          38w 0d     Det. By:  Clinical EDD             EDD:   03/07/14
Anatomy
 Cranium:          Appears normal         Aortic Arch:      Appears normal
 Fetal Cavum:      Appears normal         Ductal Arch:      Appears normal
 Ventricles:       Appears normal         Diaphragm:        Appears normal
 Choroid Plexus:   Appears normal         Stomach:          Appears normal, left
                                                            sided
 Cerebellum:       Appears normal         Abdomen:          Appears normal
 Posterior Fossa:  Not well visualized    Abdominal Wall:   Not well visualized
 Nuchal Fold:      Not applicable (>20    Cord Vessels:     Appears normal (3
                   wks GA)                                  vessel cord)
 Face:             Appears normal         Kidneys:          Appear normal
                   (orbits and profile)
 Lips:             Appears normal         Bladder:          Appears normal
 Heart:            Appears normal         Spine:            Not well visualized
                   (4CH, axis, and
                   situs)
 RVOT:             Appears normal         Lower             Appears normal
                                          Extremities:
 LVOT:             Not well visualized    Upper             Not well visualized
                                          Extremities:

 Other:  Female gender. Nasal bone visualized. Technically difficult due to
         advanced gestational age.
Targeted Anatomy

 Fetal Central Nervous System
 Lat. Ventricles:
Cervix Uterus Adnexa

 Cervix:       Not visualized (advanced GA >58wks)
 Left Ovary:    Size(cm) L: 1.64 x W: 1.5 x H: 1.05  Volume(cc):
 Right Ovary:   Size(cm) L: 2.92 x W: 1.95 x H: 1.82  Volume(cc):
Impression

 Single IUP at 38w 0d
 Cystic fibrosis carrier, late onset of care
 The fetal bowel appears prominent but not as echogenic as
 adjacent bone, and may be consistent with a typical 3rd
 trimester bowel pattern
 Normal anatomic survey, but limited due to late gestational
 age
 Fetal growth is appropriate (24th %tile)
 Normal amniotic fluid volume
Recommendations

 Follow-up ultrasounds as clinically indicated.

                 Attending Physician, [email or web address]

## 2017-06-18 ENCOUNTER — Other Ambulatory Visit (HOSPITAL_COMMUNITY): Payer: Self-pay | Admitting: Chiropractic Medicine

## 2017-06-18 ENCOUNTER — Ambulatory Visit (HOSPITAL_COMMUNITY)
Admission: RE | Admit: 2017-06-18 | Discharge: 2017-06-18 | Disposition: A | Discharge status: Home / Self Care | Payer: No Typology Code available for payment source | Source: Ambulatory Visit | Attending: Chiropractic Medicine | Admitting: Chiropractic Medicine

## 2017-06-18 DIAGNOSIS — M545 Low back pain: Secondary | ICD-10-CM
